# Patient Record
Sex: Female | Born: 1960 | Race: Black or African American | Hispanic: No | Smoking: Current every day smoker
Health system: Southern US, Community
[De-identification: ages and names within clinical notes are randomized; demographics above are authoritative.]

## PROBLEM LIST (undated history)

## (undated) DIAGNOSIS — M797 Fibromyalgia: Secondary | ICD-10-CM

## (undated) DIAGNOSIS — M1990 Unspecified osteoarthritis, unspecified site: Secondary | ICD-10-CM

## (undated) HISTORY — PX: ABDOMINAL HYSTERECTOMY: SHX81

---

## 2012-03-14 ENCOUNTER — Emergency Department (HOSPITAL_COMMUNITY): Payer: Self-pay

## 2012-03-14 ENCOUNTER — Emergency Department (HOSPITAL_COMMUNITY)
Admission: EM | Admit: 2012-03-14 | Discharge: 2012-03-14 | Disposition: A | Discharge status: Home / Self Care | Payer: Self-pay | Attending: Emergency Medicine | Admitting: Emergency Medicine

## 2012-03-14 ENCOUNTER — Encounter (HOSPITAL_COMMUNITY): Payer: Self-pay

## 2012-03-14 DIAGNOSIS — M129 Arthropathy, unspecified: Secondary | ICD-10-CM | POA: Insufficient documentation

## 2012-03-14 DIAGNOSIS — IMO0001 Reserved for inherently not codable concepts without codable children: Secondary | ICD-10-CM | POA: Insufficient documentation

## 2012-03-14 DIAGNOSIS — F17200 Nicotine dependence, unspecified, uncomplicated: Secondary | ICD-10-CM | POA: Insufficient documentation

## 2012-03-14 DIAGNOSIS — M79609 Pain in unspecified limb: Secondary | ICD-10-CM | POA: Insufficient documentation

## 2012-03-14 DIAGNOSIS — M79673 Pain in unspecified foot: Secondary | ICD-10-CM

## 2012-03-14 HISTORY — DX: Unspecified osteoarthritis, unspecified site: M19.90

## 2012-03-14 HISTORY — DX: Fibromyalgia: M79.7

## 2012-03-14 MED ORDER — OXYCODONE-ACETAMINOPHEN 5-325 MG PO TABS
1.0000 | ORAL_TABLET | Freq: Once | ORAL | Status: AC
  Administered 2012-03-14: 1 via ORAL
  Filled 2012-03-14: qty 1

## 2012-03-14 MED ORDER — HYDROCODONE-ACETAMINOPHEN 5-500 MG PO TABS: 1.0000 | ORAL_TABLET | Freq: Four times a day (QID) | ORAL | Status: AC | PRN

## 2012-03-14 NOTE — ED Notes (Signed)
Patient transported to X-ray 

## 2012-03-14 NOTE — ED Notes (Signed)
Pt presents with continued L foot pain after having a carpet machine fall on her foot x 2 weeks ago at [employer].  Pt seen at [facility] after injury, was placed in a foot boot, but reports pain continued.

## 2012-03-14 NOTE — ED Provider Notes (Signed)
History   This chart was scribed for Jaime Chick, MD by Jaime Berry. The patient was seen in room TR10C/TR10C. Patient's care was started at 1347.     CSN: [id number]  Arrival date & time 03/14/12  1347   First MD Initiated Contact with Patient 03/14/12 1431      Chief Complaint  Patient presents with  . Foot Pain    (Consider location/radiation/quality/duration/timing/severity/associated sxs/prior treatment) Patient is a 51 y.o. female presenting with lower extremity pain. The history is provided by the patient. No language interpreter was used.  Foot Pain This is a new problem. The current episode started more than 1 week ago. The problem occurs constantly. The problem has not changed since onset.The symptoms are aggravated by standing. The symptoms are relieved by narcotics. Treatments tried: Prescription pain medication. The treatment provided significant relief.    Jaime Berry is a 51 y.o. female who presents to the Emergency Department complaining of moderate to severe, left foot pain onset 2 weeks ago when a carpet machine fell on her foot at [employer]. Patient reports soreness over her Achilles tendon. Patient says that when she tries to bear weight on her foot, pain shoots up her foot and to her knee. She also has trouble sleeping because the pain is so severe. Patient states that she was seen at [facility] immediately after injury and was placed in a foot boot, but pain has persisted. Patient says that she got x-rays done and they showed no fracture. Patient received a prescription for pain medication, but she told her nurse that she has run out. Patient hasn't yet followed up with an orthopedist, but she has an appointment on August 15. Patient has a medical history of fibromyalgia and arthritis. Surgical history includes abdominal hysterectomy. Patient is a current everyday smoker (0.5 packs/day).  Past Medical History  Diagnosis Date  . Fibromyalgia   .  Arthritis     Past Surgical History  Procedure Date  . Abdominal hysterectomy     No family history on file.  History  Substance Use Topics  . Smoking status: Current Everyday Smoker -- 0.5 packs/day  . Smokeless tobacco: Not on file  . Alcohol Use: Yes    OB History    Grav Para Term Preterm Abortions TAB SAB Ect Mult Living                  Review of Systems  Musculoskeletal:       Patient is positive for left foot pain.  All other systems reviewed and are negative.    Allergies  Review of patient's allergies indicates no known allergies.  Home Medications   Current Outpatient Rx  Name Route Sig Dispense Refill  . ALBUTEROL SULFATE HFA 108 (90 BASE) MCG/ACT IN AERS Inhalation Inhale 1-2 puffs into the lungs every 6 (six) hours as needed. As needed for shortness of breath.    Marland Kitchen XANAX PO Oral Take 1 tablet by mouth 2 (two) times daily as needed. As needed for anxiety.    Marland Kitchen GABAPENTIN 300 MG PO CAPS Oral Take 300 mg by mouth 2 (two) times daily.    Marland Kitchen HYDROCODONE-ACETAMINOPHEN 5-500 MG PO TABS Oral Take 1 tablet by mouth every 6 (six) hours as needed. As needed for pain.    . IBUPROFEN 800 MG PO TABS Oral Take 800 mg by mouth every 8 (eight) hours as needed. As needed for pain.    . OXYCODONE-ACETAMINOPHEN 10-325 MG PO TABS Oral Take  1 tablet by mouth every 6 (six) hours as needed. As needed for pain.    Marland Kitchen LYRICA PO Oral Take 1 capsule by mouth at bedtime.    Marland Kitchen PRESCRIPTION MEDICATION Oral Take 1 tablet by mouth daily as needed. Muscle relaxer. As needed for muscle spasms.    Marland Kitchen HYDROCODONE-ACETAMINOPHEN 5-500 MG PO TABS Oral Take 1-2 tablets by mouth every 6 (six) hours as needed for pain. 15 tablet 0    BP 107/68  Pulse 75  Temp 98.3 F (36.8 C) (Oral)  Resp 18  Ht 5\' 5"  (1.651 m)  Wt 150 lb (68.04 kg)  BMI 24.96 kg/m2  SpO2 98%  Physical Exam  Nursing note and vitals reviewed. Constitutional: She is oriented to person, place, and time. She appears  well-developed and well-nourished. No distress.  HENT:  Head: Normocephalic and atraumatic.  Eyes: EOM are normal. Pupils are equal, round, and reactive to light.  Neck: Neck supple. No tracheal deviation present.  Cardiovascular: Normal rate.   Pulses:      Dorsalis pedis pulses are 2+ on the left side.  Pulmonary/Chest: Effort normal. No respiratory distress.  Abdominal: Soft. She exhibits no distension.  Musculoskeletal: Normal range of motion. She exhibits no edema.       2+ DP pulse. Distally, neurovascularly intact. No bony point tenderness of left foot. Tender to palpation over distal achilles region. Left knee is normal.  Neurological: She is alert and oriented to person, place, and time. No sensory deficit.  Skin: Skin is warm and dry.  Psychiatric: She has a normal mood and affect. Her behavior is normal.    ED Course  Procedures (including critical care time) DIAGNOSTIC STUDIES: Oxygen Saturation is 98% on room air, normal by my interpretation.    COORDINATION OF CARE: 3:05pm- Patient informed of current plan for treatment and evaluation and agrees with plan at this time. Will order an X-ray to rule out any fractures unseen during patient's first ED visit.  Labs Reviewed - No data to display  Dg Ankle Complete Left  03/14/2012  *RADIOLOGY REPORT*  Clinical Data: Fall, pain, difficulty bearing weight  LEFT ANKLE COMPLETE - 3+ VIEW  Comparison: 03/14/2012  Findings: Normal alignment without fracture.  Intact malleoli, talus and calcaneus.  No radiographic swelling.  IMPRESSION: No acute finding  Original Report Authenticated By: Judie Petit. Ruel Favors, M.D.   Dg Foot Complete Left  03/14/2012  *RADIOLOGY REPORT*  Clinical Data: Left foot trauma, pain, difficulty bearing weight  LEFT FOOT - COMPLETE 3+ VIEW  Comparison: 03/14/2012 ankle exam  Findings: Normal alignment without fracture.  Slight degenerative changes of the left first MTP joint.  No fracture evident.  No radiographic  swelling or foreign body.  IMPRESSION: No acute finding  Original Report Authenticated By: Judie Petit. TREVOR Miles Costain, M.D.     1. Foot pain       MDM  Pt with continued pain in foot after injury several weeks ago.  Has been seen x 2 in [place] ED.  Pt has been wearing cam walker and has orthopedic f/u scheduled for August.  Pt is requesting pain medication.  Points primarily to distal achilles with pain- no acute tendon rupture, xrays show no bony abnormality.  Pt stongly encouraged to arrange for follow up with orthopedics.  Discharged with strict return precautions.  Pt agreeable with plan.    I personally performed the services described in this documentation, which was scribed in my presence. The recorded information has been reviewed and considered.  Jaime Chick, MD 03/15/12 [phone]

## 2012-03-14 NOTE — ED Notes (Signed)
Patient transported from X-ray 

## 2016-02-04 ENCOUNTER — Emergency Department (HOSPITAL_COMMUNITY): Payer: Medicaid - Out of State

## 2016-02-04 ENCOUNTER — Emergency Department (HOSPITAL_COMMUNITY)
Admission: EM | Admit: 2016-02-04 | Discharge: 2016-02-04 | Disposition: A | Discharge status: Home / Self Care | Payer: Medicaid - Out of State | Attending: Emergency Medicine | Admitting: Emergency Medicine

## 2016-02-04 ENCOUNTER — Encounter (HOSPITAL_COMMUNITY): Payer: Self-pay | Admitting: Emergency Medicine

## 2016-02-04 DIAGNOSIS — M1990 Unspecified osteoarthritis, unspecified site: Secondary | ICD-10-CM | POA: Diagnosis not present

## 2016-02-04 DIAGNOSIS — F17200 Nicotine dependence, unspecified, uncomplicated: Secondary | ICD-10-CM | POA: Diagnosis not present

## 2016-02-04 DIAGNOSIS — Z791 Long term (current) use of non-steroidal anti-inflammatories (NSAID): Secondary | ICD-10-CM | POA: Diagnosis not present

## 2016-02-04 DIAGNOSIS — K122 Cellulitis and abscess of mouth: Secondary | ICD-10-CM

## 2016-02-04 DIAGNOSIS — R1084 Generalized abdominal pain: Secondary | ICD-10-CM | POA: Insufficient documentation

## 2016-02-04 DIAGNOSIS — Z79899 Other long term (current) drug therapy: Secondary | ICD-10-CM | POA: Insufficient documentation

## 2016-02-04 DIAGNOSIS — R109 Unspecified abdominal pain: Secondary | ICD-10-CM

## 2016-02-04 LAB — LIPASE, BLOOD: Lipase: 18 U/L (ref 11–51)

## 2016-02-04 LAB — COMPREHENSIVE METABOLIC PANEL
ALBUMIN: 3.7 g/dL (ref 3.5–5.0)
ALK PHOS: 72 U/L (ref 38–126)
ALT: 16 U/L (ref 14–54)
AST: 20 U/L (ref 15–41)
Anion gap: 7 (ref 5–15)
BILIRUBIN TOTAL: 0.9 mg/dL (ref 0.3–1.2)
BUN: 11 mg/dL (ref 6–20)
CO2: 22 mmol/L (ref 22–32)
Calcium: 8.2 mg/dL — ABNORMAL LOW (ref 8.9–10.3)
Chloride: 109 mmol/L (ref 101–111)
Creatinine, Ser: 0.73 mg/dL (ref 0.44–1.00)
GFR calc Af Amer: >60 mL/min (ref >60)
GFR calc non Af Amer: >60 mL/min (ref >60)
GLUCOSE: 96 mg/dL (ref 65–99)
POTASSIUM: 3.6 mmol/L (ref 3.5–5.1)
Sodium: 138 mmol/L (ref 135–145)
TOTAL PROTEIN: 6.2 g/dL — AB (ref 6.5–8.1)

## 2016-02-04 LAB — RAPID STREP SCREEN (MED CTR MEBANE ONLY): Streptococcus, Group A Screen (Direct): NEGATIVE

## 2016-02-04 LAB — URINALYSIS, ROUTINE W REFLEX MICROSCOPIC
BILIRUBIN URINE: NEGATIVE
GLUCOSE, UA: NEGATIVE mg/dL
HGB URINE DIPSTICK: NEGATIVE
KETONES UR: NEGATIVE mg/dL
Leukocytes, UA: NEGATIVE
NITRITE: NEGATIVE
PH: 5.5 (ref 5.0–8.0)
Protein, ur: NEGATIVE mg/dL
Specific Gravity, Urine: <1.005 — ABNORMAL LOW (ref 1.005–1.030)

## 2016-02-04 LAB — CBC WITH DIFFERENTIAL/PLATELET
BASOS PCT: 0 %
Basophils Absolute: 0 10*3/uL (ref 0.0–0.1)
Eosinophils Absolute: 0.1 10*3/uL (ref 0.0–0.7)
Eosinophils Relative: 1 %
HEMATOCRIT: 42.9 % (ref 36.0–46.0)
Hemoglobin: 14 g/dL (ref 12.0–15.0)
Lymphocytes Relative: 21 %
Lymphs Abs: 1.8 10*3/uL (ref 0.7–4.0)
MCH: 30.4 pg (ref 26.0–34.0)
MCHC: 32.6 g/dL (ref 30.0–36.0)
MCV: 93.3 fL (ref 78.0–100.0)
MONO ABS: 0.6 10*3/uL (ref 0.1–1.0)
MONOS PCT: 6 %
NEUTROS ABS: 6.4 10*3/uL (ref 1.7–7.7)
Neutrophils Relative %: 72 %
Platelets: 217 10*3/uL (ref 150–400)
RBC: 4.6 MIL/uL (ref 3.87–5.11)
RDW: 12.4 % (ref 11.5–15.5)
WBC: 8.8 10*3/uL (ref 4.0–10.5)

## 2016-02-04 MED ORDER — DICYCLOMINE HCL 10 MG/ML IM SOLN
20.0000 mg | Freq: Once | INTRAMUSCULAR | Status: AC
  Administered 2016-02-04: 20 mg via INTRAMUSCULAR
  Filled 2016-02-04: qty 2

## 2016-02-04 MED ORDER — ONDANSETRON HCL 4 MG PO TABS: 4.0000 mg | ORAL_TABLET | Freq: Three times a day (TID) | ORAL | Status: AC | PRN

## 2016-02-04 MED ORDER — FAMOTIDINE 20 MG PO TABS: 20.0000 mg | ORAL_TABLET | Freq: Two times a day (BID) | ORAL | Status: AC

## 2016-02-04 MED ORDER — DICYCLOMINE HCL 20 MG PO TABS: 20.0000 mg | ORAL_TABLET | Freq: Four times a day (QID) | ORAL | Status: AC | PRN

## 2016-02-04 MED ORDER — AMOXICILLIN 500 MG PO CAPS: 500.0000 mg | ORAL_CAPSULE | Freq: Three times a day (TID) | ORAL | Status: AC

## 2016-02-04 MED ORDER — SODIUM CHLORIDE 0.9 % IV SOLN
INTRAVENOUS | Status: DC
  Administered 2016-02-04: 10:00:00 via INTRAVENOUS

## 2016-02-04 MED ORDER — FAMOTIDINE IN NACL 20-0.9 MG/50ML-% IV SOLN
20.0000 mg | Freq: Once | INTRAVENOUS | Status: AC
  Administered 2016-02-04: 20 mg via INTRAVENOUS
  Filled 2016-02-04: qty 50

## 2016-02-04 MED ORDER — IOPAMIDOL (ISOVUE-300) INJECTION 61%
100.0000 mL | Freq: Once | INTRAVENOUS | Status: AC | PRN
  Administered 2016-02-04: 100 mL via INTRAVENOUS

## 2016-02-04 MED ORDER — DIATRIZOATE MEGLUMINE & SODIUM 66-10 % PO SOLN
ORAL | Status: AC
Start: 2016-02-04 — End: 2016-02-04
  Administered 2016-02-04: 30 mL
  Filled 2016-02-04: qty 30

## 2016-02-04 NOTE — ED Provider Notes (Signed)
CSN: [id number]     Arrival date & time 02/04/16  0944 History   First MD Initiated Contact with Patient 02/04/16 [phone]     Chief Complaint  Patient presents with  . Abdominal Pain      HPI  Pt was seen at 0955.  Per pt, c/o gradual onset and persistence of constant generalized abd "pain" for the past 3 to 4 days.  Has been associated with multiple intermittent episodes of diarrhea.  Describes the abd pain as "bloating," which worsens after she eats.  Pt also c/o "swelling" to her uvula for the past several days. Denies sore throat, no choking, no dysphagia, no stridor, no N/V, no fevers, no back pain, no rash, no CP/SOB, no black or blood in stools.      Past Medical History  Diagnosis Date  . Fibromyalgia   . Arthritis    Past Surgical History  Procedure Laterality Date  . Abdominal hysterectomy      Social History  Substance Use Topics  . Smoking status: Current Every Day Smoker -- 0.50 packs/day  . Smokeless tobacco: None  . Alcohol Use: Yes    Review of Systems ROS: Statement: All systems negative except as marked or noted in the HPI; Constitutional: Negative for fever and chills. ; ; Eyes: Negative for eye pain, redness and discharge. ; ; ENMT: +uvula swollen. Negative for ear pain, hoarseness, nasal congestion, sinus pressure and sore throat. ; ; Cardiovascular: Negative for chest pain, palpitations, diaphoresis, dyspnea and peripheral edema. ; ; Respiratory: Negative for cough, wheezing and stridor. ; ; Gastrointestinal: +abd pain, nausea. Negative for nausea, vomiting, blood in stool, hematemesis, jaundice and rectal bleeding. . ; ; Genitourinary: Negative for dysuria, flank pain and hematuria. ; ; Musculoskeletal: Negative for back pain and neck pain. Negative for swelling and trauma.; ; Skin: Negative for pruritus, rash, abrasions, blisters, bruising and skin lesion.; ; Neuro: Negative for headache, lightheadedness and neck stiffness. Negative for weakness, altered level of  consciousness, altered mental status, extremity weakness, paresthesias, involuntary movement, seizure and syncope.      Allergies  Review of patient's allergies indicates no known allergies.  Home Medications   Prior to Admission medications   Medication Sig Start Date End Date Taking? Authorizing Provider  albuterol (PROVENTIL HFA;VENTOLIN HFA) 108 (90 BASE) MCG/ACT inhaler Inhale 1-2 puffs into the lungs every 6 (six) hours as needed. As needed for shortness of breath.    Historical Provider, MD  ALPRAZolam (XANAX PO) Take 1 tablet by mouth 2 (two) times daily as needed. As needed for anxiety.    Historical Provider, MD  gabapentin (NEURONTIN) 300 MG capsule Take 300 mg by mouth 2 (two) times daily.    Historical Provider, MD  HYDROcodone-acetaminophen (VICODIN) 5-500 MG per tablet Take 1 tablet by mouth every 6 (six) hours as needed. As needed for pain.    Historical Provider, MD  ibuprofen (ADVIL,MOTRIN) 800 MG tablet Take 800 mg by mouth every 8 (eight) hours as needed. As needed for pain.    Historical Provider, MD  oxyCODONE-acetaminophen (PERCOCET) 10-325 MG per tablet Take 1 tablet by mouth every 6 (six) hours as needed. As needed for pain.    Historical Provider, MD  Pregabalin (LYRICA PO) Take 1 capsule by mouth at bedtime.    Historical Provider, MD  PRESCRIPTION MEDICATION Take 1 tablet by mouth daily as needed. Muscle relaxer. As needed for muscle spasms.    Historical Provider, MD   BP 147/105 mmHg  Pulse 73  Temp(Src) 98.2 F (36.8 C) (Oral)  Resp 16  Ht  (1.651 m)  Wt 140 lb (63.504 kg)  BMI 23.30 kg/m2  SpO2 98% Physical Exam  1000: Physical examination:  Nursing notes reviewed; Vital signs and O2 SAT reviewed;  Constitutional: Well developed, Well nourished, Well hydrated, In no acute distress; Head:  Normocephalic, atraumatic; Eyes: EOMI, PERRL, No scleral icterus; ENMT: Mouth and pharynx normal, Mucous membranes moist. Mouth and pharynx without lesions. +mild  erythema to posterior pharynx and uvula. +mild uvula edema, no lesions. No exudates. No intra-oral edema. No submandibular or sublingual edema. No hoarse voice, no drooling, no stridor. No pain with manipulation of larynx. No trismus. ; Neck: Supple, Full range of motion, No lymphadenopathy; Cardiovascular: Regular rate and rhythm, No gallop; Respiratory: Breath sounds clear & equal bilaterally, No wheezes.  Speaking full sentences with ease, Normal respiratory effort/excursion; Chest: Nontender, Movement normal; Abdomen: Soft, +diffuse tenderness to palp. No rebound or guarding. Nondistended, Normal bowel sounds; Genitourinary: No CVA tenderness; Extremities: Pulses normal, No tenderness, No edema, No calf edema or asymmetry.; Neuro: AA&Ox3, Major CN grossly intact.  Speech clear. No gross focal motor or sensory deficits in extremities.; Skin: Color normal, Warm, Dry.   ED Course  Procedures (including critical care time) Labs Review  Imaging Review  I have personally reviewed and evaluated these images and lab results as part of my medical decision-making.   EKG Interpretation None      MDM  MDM Reviewed: previous chart, nursing note and vitals Reviewed previous: labs Interpretation: labs, x-ray and CT scan     Results for orders placed or performed during the hospital encounter of 02/04/16  Rapid strep screen  Result Value Ref Range   Streptococcus, Group A Screen (Direct) NEGATIVE NEGATIVE  Comprehensive metabolic panel  Result Value Ref Range   Sodium 138 135 - 145 mmol/L   Potassium 3.6 3.5 - 5.1 mmol/L   Chloride 109 101 - 111 mmol/L   CO2 22 22 - 32 mmol/L   Glucose, Bld 96 65 - 99 mg/dL   BUN 11 6 - 20 mg/dL   Creatinine, Ser 9.60 0.44 - 1.00 mg/dL   Calcium 8.2 (L) 8.9 - 10.3 mg/dL   Total Protein 6.2 (L) 6.5 - 8.1 g/dL   Albumin 3.7 3.5 - 5.0 g/dL   AST 20 15 - 41 U/L   ALT 16 14 - 54 U/L   Alkaline Phosphatase 72 38 - 126 U/L   Total Bilirubin 0.9 0.3 - 1.2  mg/dL   GFR calc non Af Amer >60 >60 mL/min   GFR calc Af Amer >60 >60 mL/min   Anion gap 7 5 - 15  Lipase, blood  Result Value Ref Range   Lipase 18 11 - 51 U/L  CBC with Differential  Result Value Ref Range   WBC 8.8 4.0 - 10.5 K/uL   RBC 4.60 3.87 - 5.11 MIL/uL   Hemoglobin 14.0 12.0 - 15.0 g/dL   HCT 45.4 09.8 - 11.9 %   MCV 93.3 78.0 - 100.0 fL   MCH 30.4 26.0 - 34.0 pg   MCHC 32.6 30.0 - 36.0 g/dL   RDW 14.7 82.9 - 56.2 %   Platelets 217 150 - 400 K/uL   Neutrophils Relative % 72 %   Neutro Abs 6.4 1.7 - 7.7 K/uL   Lymphocytes Relative 21 %   Lymphs Abs 1.8 0.7 - 4.0 K/uL   Monocytes Relative 6 %   Monocytes Absolute 0.6 0.1 -  1.0 K/uL   Eosinophils Relative 1 %   Eosinophils Absolute 0.1 0.0 - 0.7 K/uL   Basophils Relative 0 %   Basophils Absolute 0.0 0.0 - 0.1 K/uL  Urinalysis, Routine w reflex microscopic  Result Value Ref Range   Color, Urine YELLOW YELLOW   APPearance HAZY (A) CLEAR   Specific Gravity, Urine <1.005 (L) 1.005 - 1.030   pH 5.5 5.0 - 8.0   Glucose, UA NEGATIVE NEGATIVE mg/dL   Hgb urine dipstick NEGATIVE NEGATIVE   Bilirubin Urine NEGATIVE NEGATIVE   Ketones, ur NEGATIVE NEGATIVE mg/dL   Protein, ur NEGATIVE NEGATIVE mg/dL   Nitrite NEGATIVE NEGATIVE   Leukocytes, UA NEGATIVE NEGATIVE   Dg Chest 2 View 02/04/2016  CLINICAL DATA:  Abdominal pain.  Diarrhea.  Smoker. EXAM: CHEST  2 VIEW COMPARISON:  No recent prior. FINDINGS: Mediastinum hilar structures normal. Borderline cardiomegaly. Normal pulmonary vascularity. No focal infiltrate. Tiny bilateral pleural effusions cannot be excluded . No pneumothorax. No acute bony abnormality . IMPRESSION: Borderline cardiomegaly. Tiny bilateral pleural effusions cannot be excluded Electronically Signed   By: Maisie Fushomas  Register   On: 02/04/2016 11:40   Ct Abdomen Pelvis W Contrast 02/04/2016  CLINICAL DATA:  Patient c/o abd pain and bloating for 4 days, diarrhea x 2 days, and swelling to throat/ hx hysterectomy  EXAM: CT ABDOMEN AND PELVIS WITH CONTRAST TECHNIQUE: Multidetector CT imaging of the abdomen and pelvis was performed using the standard protocol following bolus administration of intravenous contrast. CONTRAST:  100mL ISOVUE-300 IOPAMIDOL (ISOVUE-300) INJECTION 61% COMPARISON:  None. FINDINGS: Lung bases:  Clear.  Heart normal size. Liver, spleen, gallbladder, pancreas, adrenal glands:  Normal. Kidneys, ureters, bladder:  Normal. Uterus and adnexa: Uterus surgically absent. No pelvic/ adnexal masses. Vascular: There are atherosclerotic calcifications along the aorta and iliac vessels. No aneurysm. Lymph nodes:  No adenopathy. Ascites:  None. Gastrointestinal: Normal stomach and small bowel. There are few scattered colonic diverticula. No diverticulitis. Colon otherwise unremarkable. Appendix is surgically absent. Musculoskeletal: Disc degenerative changes at L5-S1. No other abnormalities. IMPRESSION: 1. No acute findings. No findings to account for this patient's symptoms. 2. There are few colonic diverticula but no evidence of diverticulitis or other colonic inflammation. 3. Status post hysterectomy and appendectomy. 4. Degenerative changes at L5-S1. 5. Atherosclerosis of the abdominal aorta. Electronically Signed   By: Amie Portlandavid  Ormond M.D.   On: 02/04/2016 11:25    1300:  Workup reassuring. Pt has tol PO well while in the ED without N/V.  No stooling while in the ED.  Abd benign, VSS. Feels better and wants to go home now. Tx abd pain symptomatically, f/u GI MD. Tx uvulitis with abx. Dx and testing d/w pt and family.  Questions answered.  Verb understanding, agreeable to d/c home with outpt f/u.    Samuel JesterKathleen Helia Haese, DO 02/09/16 1242

## 2016-02-04 NOTE — Discharge Instructions (Signed)
Eat a bland diet, avoiding greasy, fatty, fried foods, as well as spicy and acidic foods or beverages.  Avoid eating within the hour or 2 before going to bed or laying down.  Also avoid teas, colas, coffee, chocolate, pepermint and spearment. Take the prescriptions as directed.  Call your regular medical doctor and the GI doctor today schedule a follow up appointment within the next week.  Return to the Emergency Department immediately if worsening.

## 2016-02-04 NOTE — ED Notes (Signed)
Patient with no complaints at this time. Respirations even and unlabored. Skin warm/dry. Discharge instructions reviewed with patient at this time. Patient given opportunity to voice concerns/ask questions. IV removed per policy and band-aid applied to site. Patient discharged at this time and left Emergency Department with steady gait.  

## 2016-02-04 NOTE — ED Notes (Signed)
Pt notified of need for UA.  States she will ring call light when she can produce one.

## 2016-02-04 NOTE — ED Notes (Signed)
C/o abdominal pain for last 4 days.  C/o bloating and pain, rates pain 10/10.  C/o swelling to back of throat, rates pain 9/10.  Loose stools for two days, currently stools are back to normal.

## 2016-02-07 LAB — CULTURE, GROUP A STREP (THRC)

## 2017-02-17 IMAGING — DX DG CHEST 2V
2 series · 2 of 2 positions shown · non-contrast
Comparison: No recent prior.

CLINICAL DATA: Abdominal pain.  Diarrhea.  Smoker.

EXAM:
CHEST  2 VIEW

[chest pa]
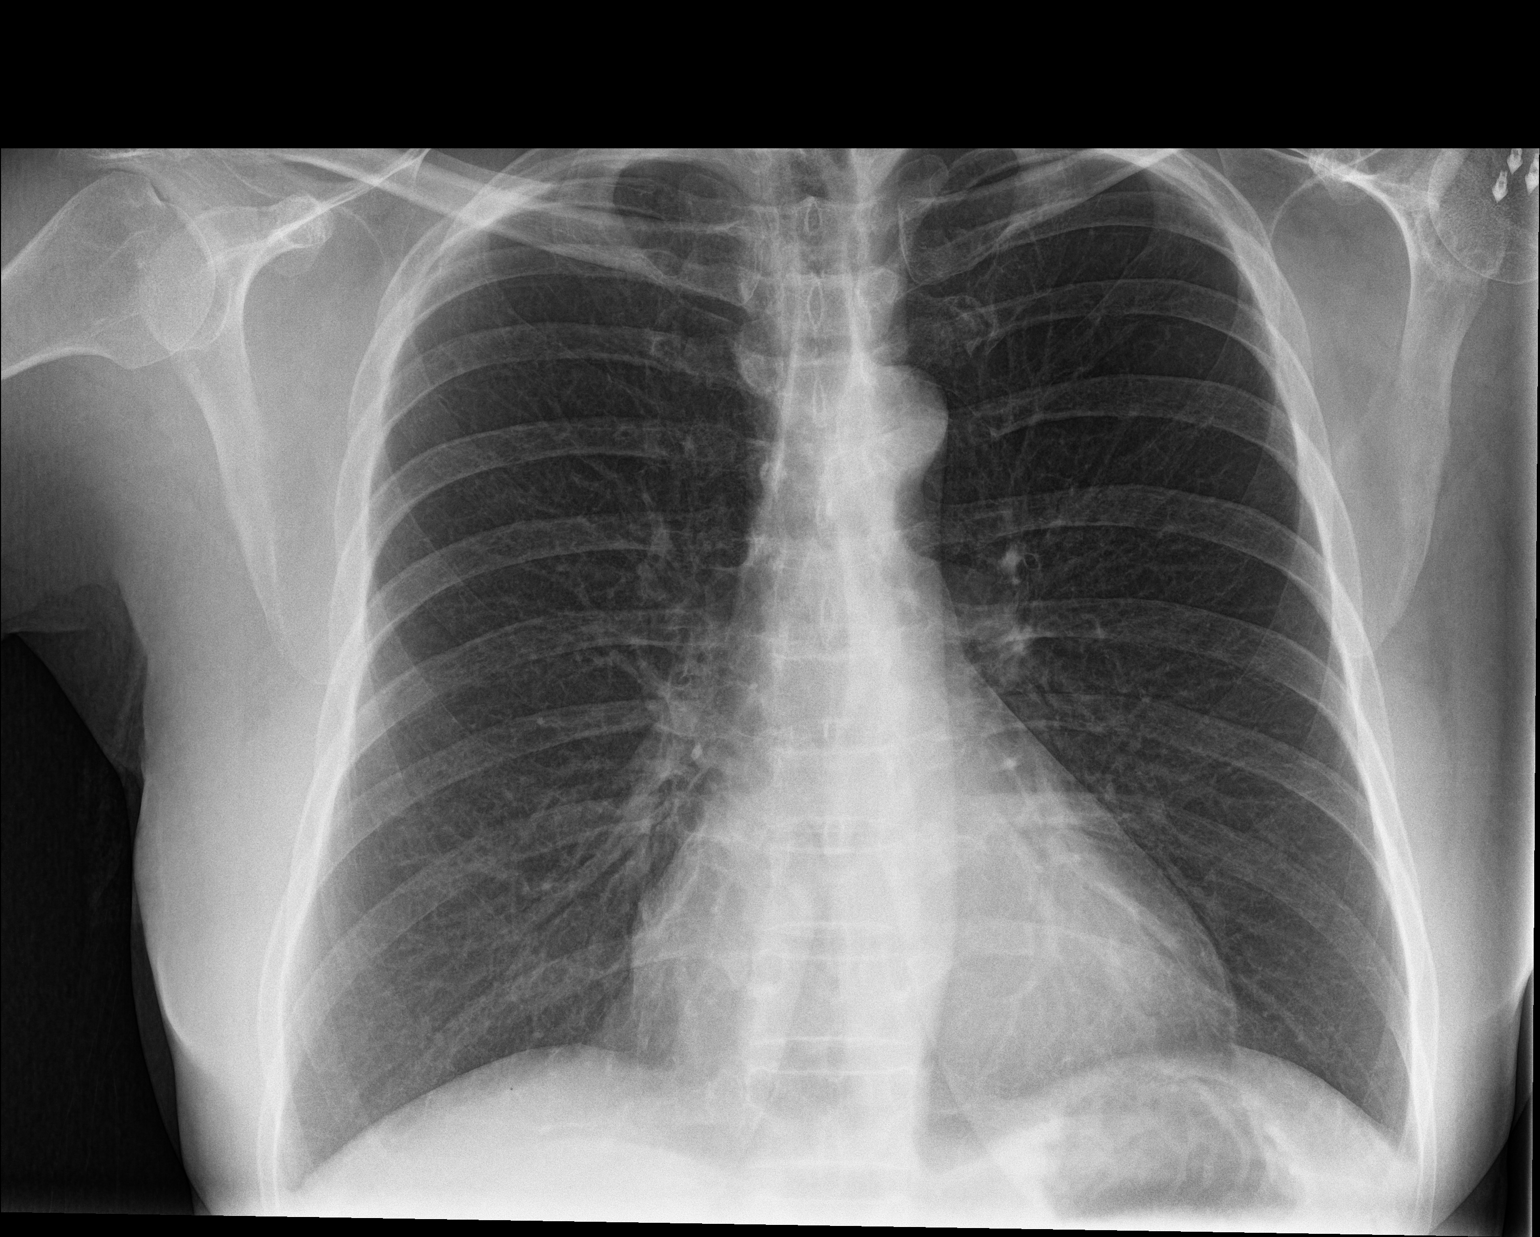

[chest lat]
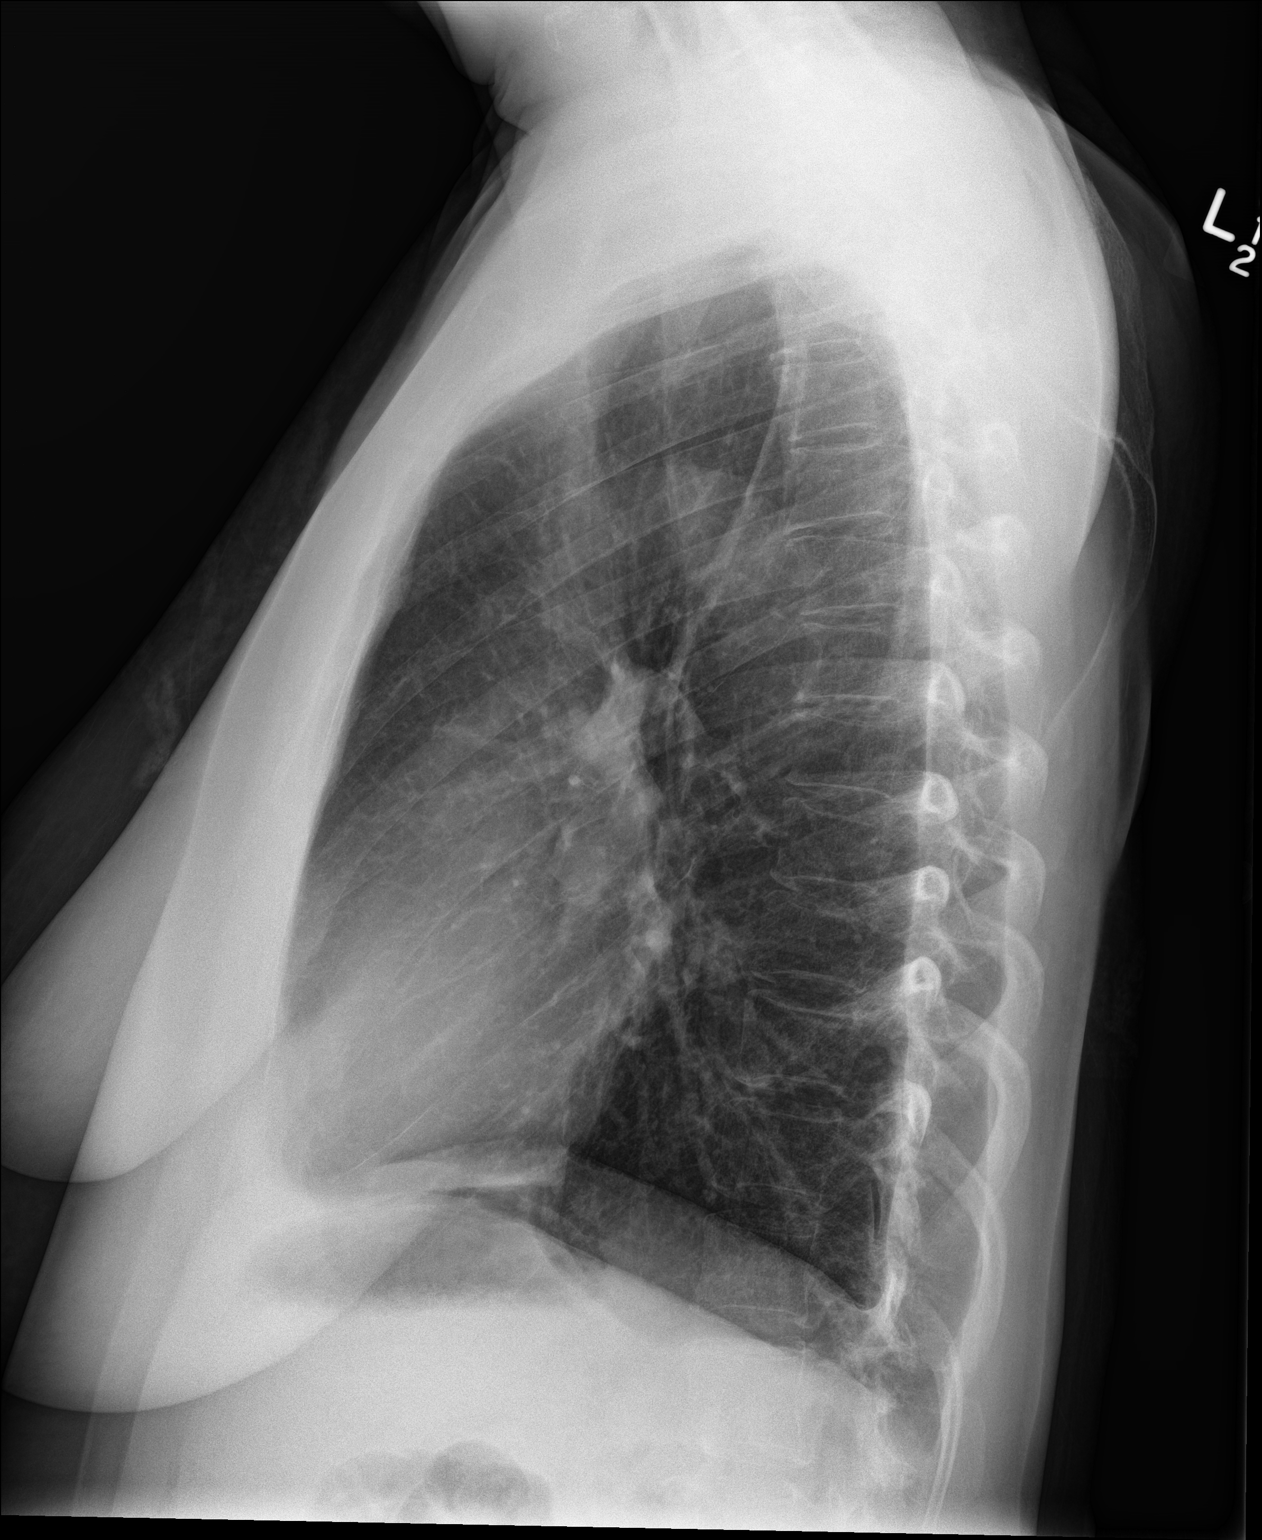

[2 of 2 positions shown; findings below may reference images not displayed]

FINDINGS: Mediastinum hilar structures normal. Borderline cardiomegaly. Normal
pulmonary vascularity. No focal infiltrate. Tiny bilateral pleural
effusions cannot be excluded . No pneumothorax. No acute bony
abnormality .
IMPRESSION: Borderline cardiomegaly. Tiny bilateral pleural effusions cannot be
excluded

## 2017-02-17 IMAGING — CT CT ABD-PELV W/ CM
2 of 5 series · 16 of 46 positions shown, 18 images · IV contrast (iopamidol)
Comparison: None.

CLINICAL DATA: Patient c/o abd pain and bloating for 4 days,
diarrhea x 2 days, and swelling to throat/ hx hysterectomy

EXAM:
CT ABDOMEN AND PELVIS WITH CONTRAST
TECHNIQUE: Multidetector CT imaging of the abdomen and pelvis was performed
using the standard protocol following bolus administration of
intravenous contrast.
CONTRAST:  100mL T0FKNK-9MM IOPAMIDOL (T0FKNK-9MM) INJECTION 61%

[Series 2: routine abd pel with · axial · 0.64mm/px · z∈[-445,-45]mm · 13 of 90 slices shown, 15 images]
[im 5/90  soft-tissue]
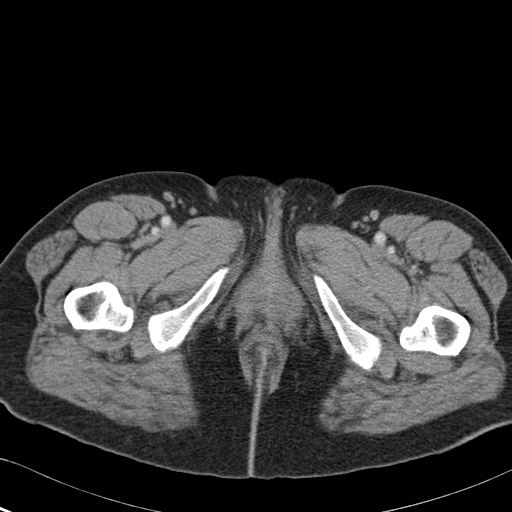
[im 5/90  bone]
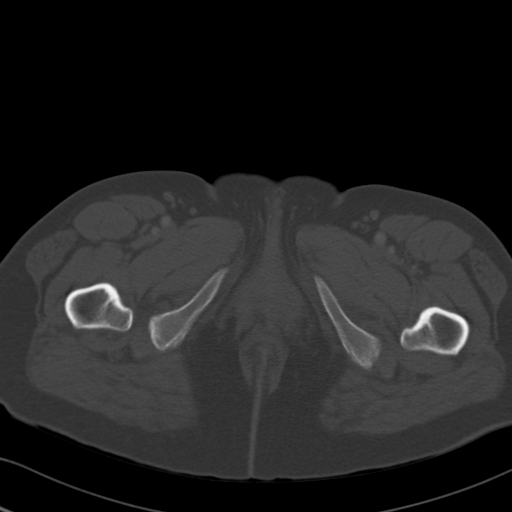
[im 15/90  soft-tissue]
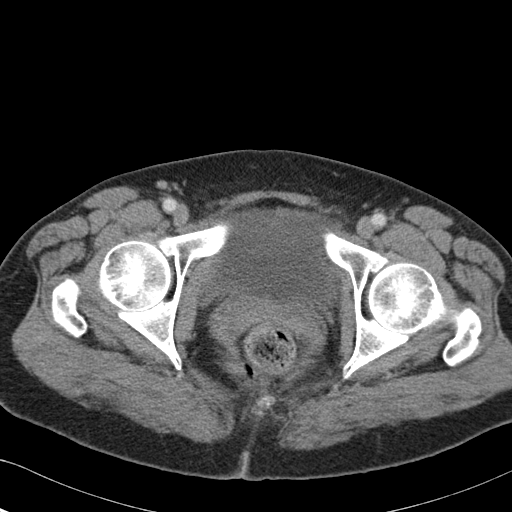
[im 19/90  soft-tissue]
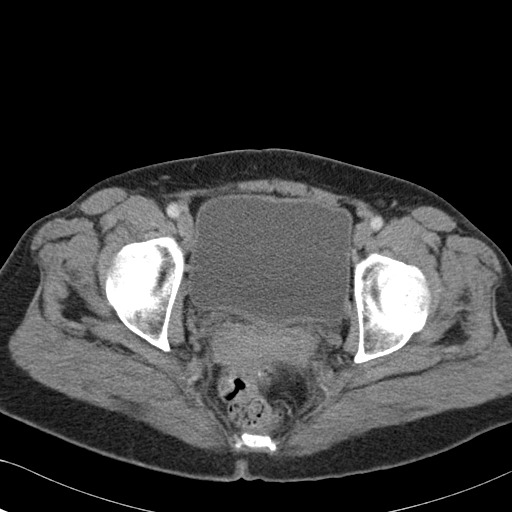
[im 24/90  soft-tissue]
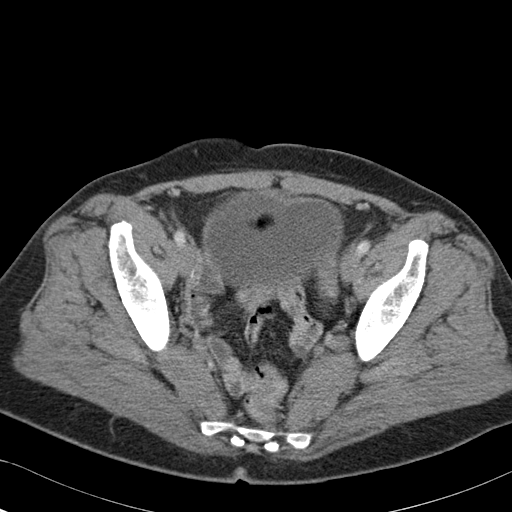
[im 33/90  soft-tissue]
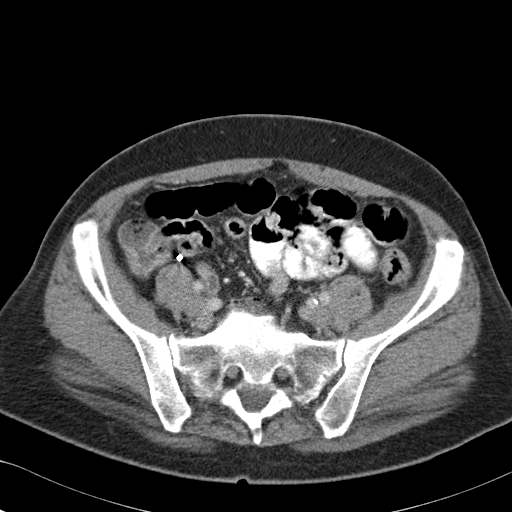
[im 38/90  soft-tissue]
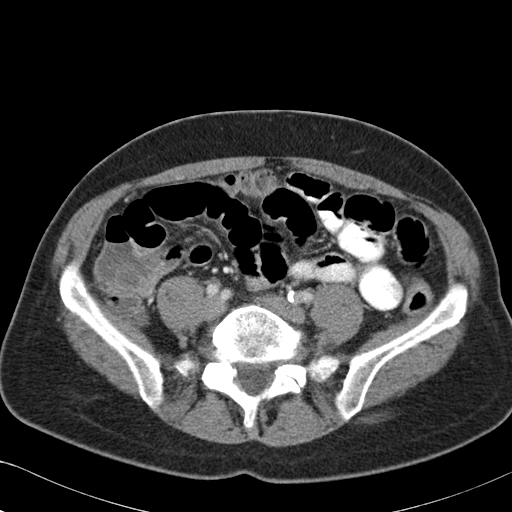
[im 47/90  soft-tissue]
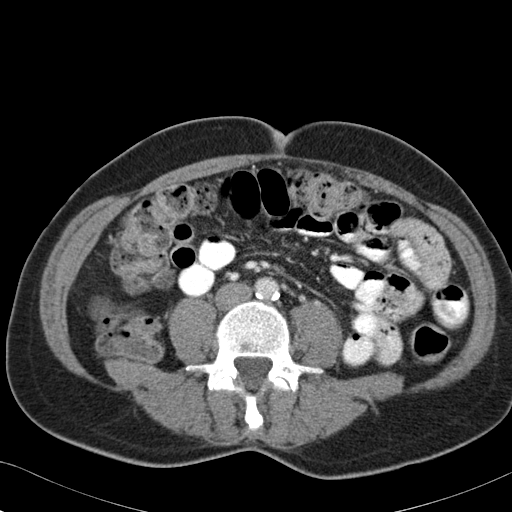
[im 52/90  soft-tissue]
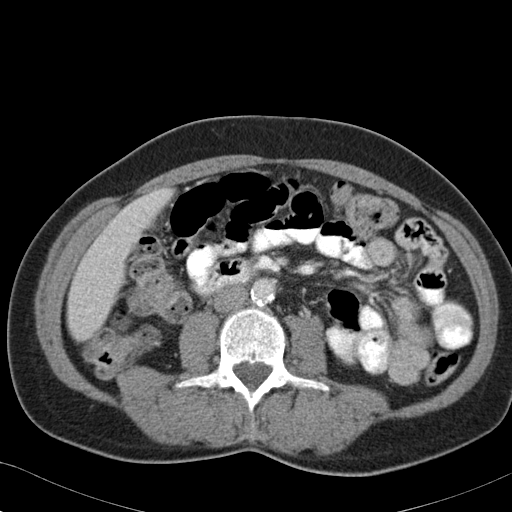
[im 57/90  soft-tissue]
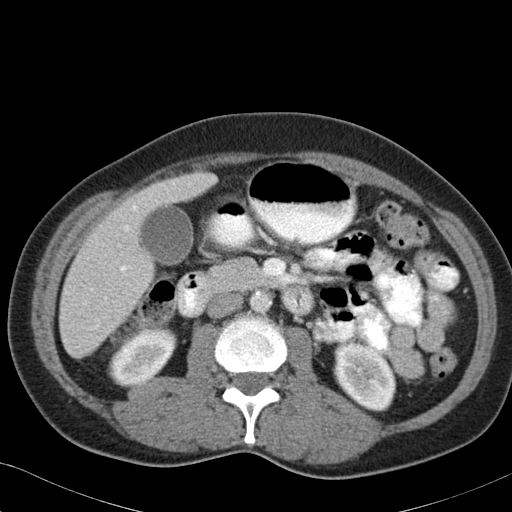
[im 57/90  bone]
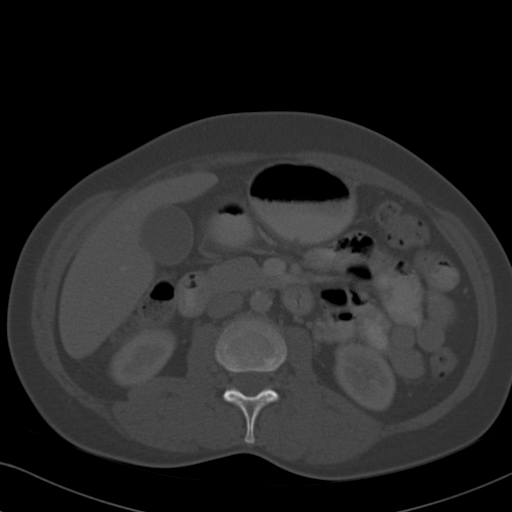
[im 66/90  soft-tissue]
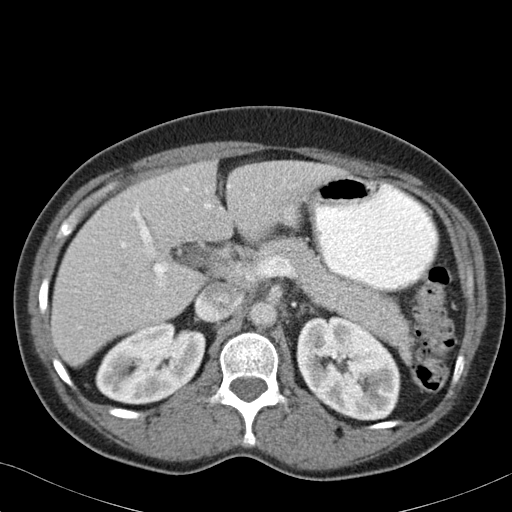
[im 71/90  soft-tissue]
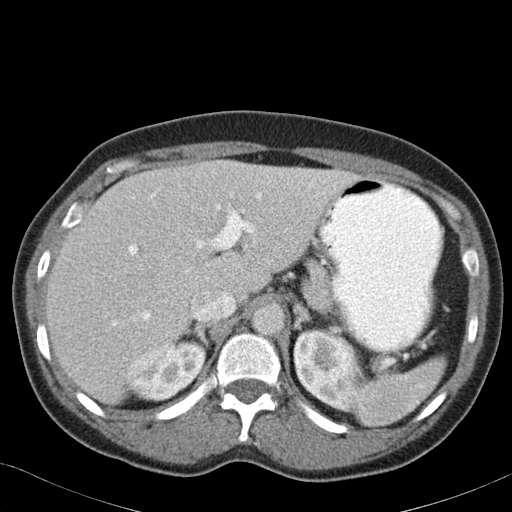
[im 75/90  soft-tissue]
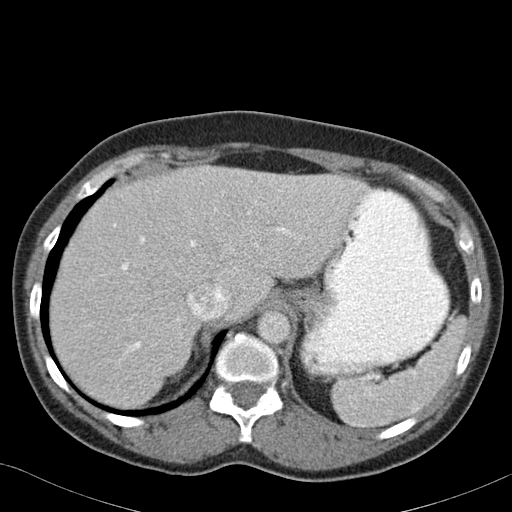
[im 85/90  soft-tissue]
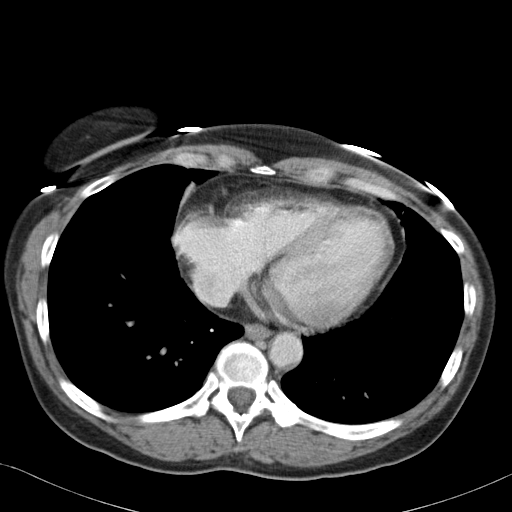

[Series 4: coronal · coronal · 0.66mm/px · 3 of 113 slices shown]
[im 38/113  soft-tissue]
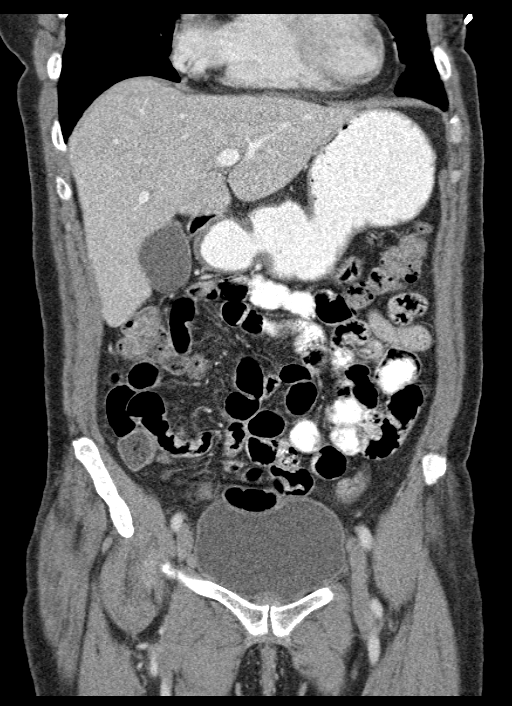
[im 50/113  soft-tissue]
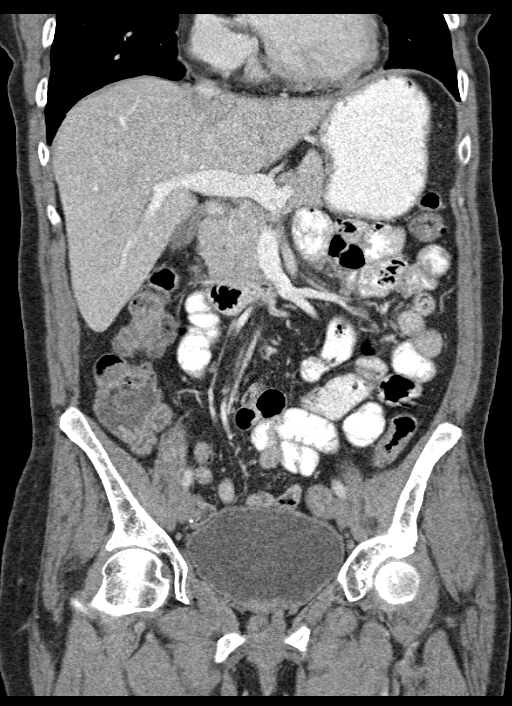
[im 63/113  soft-tissue]
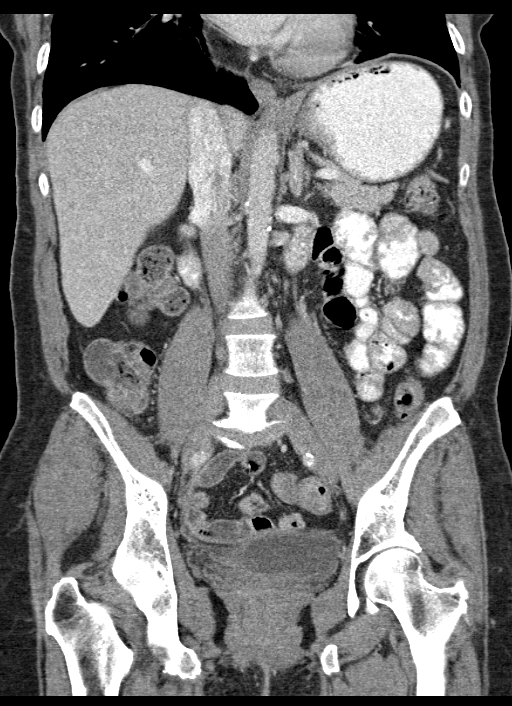

[16 of 46 positions shown; findings below may reference images not displayed]

FINDINGS: Lung bases:  Clear.  Heart normal size.

Liver, spleen, gallbladder, pancreas, adrenal glands:  Normal.

Kidneys, ureters, bladder:  Normal.

Uterus and adnexa: Uterus surgically absent. No pelvic/ adnexal
masses.

Vascular: There are atherosclerotic calcifications along the aorta
and iliac vessels. No aneurysm.

Lymph nodes:  No adenopathy.

Ascites:  None.

Gastrointestinal: Normal stomach and small bowel. There are few
scattered colonic diverticula. No diverticulitis. Colon otherwise
unremarkable. Appendix is surgically absent.

Musculoskeletal: Disc degenerative changes at L5-S1. No other
abnormalities.
IMPRESSION: 1. No acute findings. No findings to account for this patient's
symptoms.
2. There are few colonic diverticula but no evidence of
diverticulitis or other colonic inflammation.
3. Status post hysterectomy and appendectomy.
4. Degenerative changes at L5-S1.
5. Atherosclerosis of the abdominal aorta.

## 2023-05-26 ENCOUNTER — Other Ambulatory Visit: Payer: Self-pay

## 2023-05-26 ENCOUNTER — Emergency Department (HOSPITAL_COMMUNITY): Payer: Medicaid Other

## 2023-05-26 ENCOUNTER — Encounter (HOSPITAL_COMMUNITY): Payer: Self-pay

## 2023-05-26 ENCOUNTER — Emergency Department (HOSPITAL_COMMUNITY)
Admission: EM | Admit: 2023-05-26 | Discharge: 2023-05-26 | Disposition: A | Discharge status: Home / Self Care | Payer: Medicaid Other | Attending: Student | Admitting: Student

## 2023-05-26 DIAGNOSIS — M545 Low back pain, unspecified: Secondary | ICD-10-CM | POA: Diagnosis present

## 2023-05-26 DIAGNOSIS — M5442 Lumbago with sciatica, left side: Secondary | ICD-10-CM

## 2023-05-26 MED ORDER — LIDOCAINE 5 % EX PTCH
1.0000 | MEDICATED_PATCH | CUTANEOUS | 0 refills | Status: AC
Start: 2023-05-26 — End: ?

## 2023-05-26 MED ORDER — OXYCODONE-ACETAMINOPHEN 5-325 MG PO TABS
1.0000 | ORAL_TABLET | Freq: Once | ORAL | Status: AC
  Administered 2023-05-26: 1 via ORAL
  Filled 2023-05-26: qty 1

## 2023-05-26 MED ORDER — IBUPROFEN 400 MG PO TABS
600.0000 mg | ORAL_TABLET | Freq: Once | ORAL | Status: AC
  Administered 2023-05-26: 600 mg via ORAL
  Filled 2023-05-26: qty 2

## 2023-05-26 MED ORDER — LIDOCAINE 5 % EX PTCH
1.0000 | MEDICATED_PATCH | CUTANEOUS | Status: DC
  Administered 2023-05-26: 1 via TRANSDERMAL
  Filled 2023-05-26: qty 1

## 2023-05-26 MED ORDER — OXYCODONE-ACETAMINOPHEN 5-325 MG PO TABS
1.0000 | ORAL_TABLET | Freq: Four times a day (QID) | ORAL | 0 refills | Status: AC | PRN
Start: 2023-05-26 — End: ?

## 2023-05-26 MED ORDER — PREDNISONE 50 MG PO TABS
50.0000 mg | ORAL_TABLET | Freq: Every day | ORAL | 0 refills | Status: AC
Start: 2023-05-26 — End: 2023-05-31

## 2023-05-26 NOTE — ED Notes (Signed)
Denies any injuries.  Burning pain that radiates down bilateral legs. Took flexeril yesterday without relief.

## 2023-05-26 NOTE — ED Notes (Signed)
Pt verbalized understanding of no driving and to use caution within 4 hours of taking pain meds due to meds cause drowsiness, also made aware of constipation is a side effect.

## 2023-05-26 NOTE — ED Provider Notes (Signed)
[place] EMERGENCY DEPARTMENT AT [facility] Provider Note   CSN: [id number] Arrival date & time: 05/26/23  1154     History  Chief Complaint  Patient presents with   Back Pain    Jaime Berry is a 62 y.o. female.  She has PMH of fibromyalgia, IBS, arthritis.  Presents the ER today complaining of left lower back pain for the past 5 days of burning sensation rating down left buttock and posterior thigh.  No leg swelling.  No injury or trauma no saddle anesthesia or paresthesia, no bowel or bladder incontinence, no weight loss.  She never had this in the past.  Denies urinary symptoms, no abdominal pain or flank pain.  He tried Flexeril at home without relief.   Back Pain      Home Medications Prior to Admission medications   Medication Sig Start Date End Date Taking? Authorizing Provider  albuterol (PROVENTIL HFA;VENTOLIN HFA) 108 (90 BASE) MCG/ACT inhaler Inhale 1-2 puffs into the lungs every 6 (six) hours as needed. As needed for shortness of breath.    [provider]  amoxicillin (AMOXIL) 500 MG capsule Take 1 capsule (500 mg total) by mouth 3 (three) times daily. 02/04/16   Samuel Jester, DO  cetirizine (ZYRTEC) 5 MG tablet Take 5 mg by mouth daily. 01/06/16   [provider]  dicyclomine (BENTYL) 20 MG tablet Take 1 tablet (20 mg total) by mouth every 6 (six) hours as needed for spasms (abdominal cramping). 02/04/16   Samuel Jester, DO  famotidine (PEPCID) 20 MG tablet Take 1 tablet (20 mg total) by mouth 2 (two) times daily. 02/04/16   Samuel Jester, DO  gabapentin (NEURONTIN) 300 MG capsule Take 300 mg by mouth 3 (three) times daily as needed (pain).     [provider]  methocarbamol (ROBAXIN) 500 MG tablet Take 500 mg by mouth daily as needed for muscle spasms.     [provider]  ondansetron (ZOFRAN) 4 MG tablet Take 1 tablet (4 mg total) by mouth every 8 (eight) hours as needed for nausea or vomiting. 02/04/16    Samuel Jester, DO      Allergies    Asa [aspirin]    Review of Systems   Review of Systems  Musculoskeletal:  Positive for back pain.    Physical Exam Updated Vital Signs BP 125/80 (BP Location: Right Arm)   Pulse 69   Temp 97.6 F (36.4 C)   Resp 18   Ht 5\' 5"  (1.651 m)   Wt 63.5 kg   SpO2 99%   BMI 23.30 kg/m  Physical Exam Vitals and nursing note reviewed.  Constitutional:      General: She is not in acute distress.    Appearance: She is well-developed.  HENT:     Head: Normocephalic and atraumatic.  Eyes:     Conjunctiva/sclera: Conjunctivae normal.  Cardiovascular:     Rate and Rhythm: Normal rate and regular rhythm.     Heart sounds: No murmur heard. Pulmonary:     Effort: Pulmonary effort is normal. No respiratory distress.     Breath sounds: Normal breath sounds.  Abdominal:     Palpations: Abdomen is soft.     Tenderness: There is no abdominal tenderness.  Musculoskeletal:        General: No swelling.     Cervical back: Neck supple.     Right lower leg: No edema.     Left lower leg: No edema.     Comments:  Denies send spasm left lumbar area with positive left straight leg raise.  No overlying skin changes or swelling in the back  Skin:    General: Skin is warm and dry.     Capillary Refill: Capillary refill takes less than 2 seconds.     Findings: No lesion or rash.  Neurological:     General: No focal deficit present.     Mental Status: She is alert and oriented to person, place, and time.  Psychiatric:        Mood and Affect: Mood normal.     ED Results / Procedures / Treatments   Labs (all labs ordered are listed, but only abnormal results are displayed) Labs Reviewed - No data to display  EKG None  Radiology DG Lumbar Spine Complete  Result Date: 05/26/2023 CLINICAL DATA:  Lower back pain for 1 week. EXAM: LUMBAR SPINE - COMPLETE 4+ VIEW COMPARISON:  CT abdomen and pelvis dated 02/04/2016. FINDINGS: There is no evidence of lumbar  spine fracture. Alignment is normal. Up to moderate multilevel degenerative disc and joint disease, most significant at L5-S1. There is mild neuroforaminal stenosis at L5-S1 IMPRESSION: Moderate degenerative changes. Electronically Signed   By: Romona Curls M.D.   On: 05/26/2023 13:21    Procedures Procedures    Medications Ordered in ED Medications - No data to display  ED Course/ Medical Decision Making/ A&P                                 Medical Decision Making This patient presents to the ED for concern of back pain this involves an extensive number of treatment options, and is a complaint that carries with it a high risk of complications and morbidity.  The differential diagnosis includes sprain, strain, HNP, fracture, DDD, muscle spasm, cauda equina, epidural abscess or hematoma, malignancy, other   Co morbidities that complicate the patient evaluation  Fibromyalgia   Additional history obtained:  Additional history obtained from EMR External records from outside source obtained and reviewed including notes, PDMP   Imaging Studies ordered:  I ordered imaging studies including stray lumbar spine I independently visualized and interpreted imaging which showed degenerative disc disease worse at L5-S1, no fracture or traumatic malalignment I agree with the radiologist interpretation      Problem List / ED Course / Critical interventions / Medication management  Patient presents with low back pain left low back radiating down left leg with burning sensation. Differential considered as above. Symptoms are likely due to a colopathy likely from degenerative disc disease. They have no high risk features including saddle anesthesia/paresthesia, bowel or bladder incontinence, urinary retention, fever, weight loss, history of cancer or immune suppression, or IV drug use. We discussed plan to treat symptoms with analgesics, muscle relaxers, Lidoderm patch, and follow up with her  orthopedic doctor in [place]. They were advised on strict return precautions.  I ordered medication including percocet, lidoderm  for pain  Reevaluation of the patient after these medicines showed that the patient improved I have reviewed the patients home medicines and have made adjustments as needed      Amount and/or Complexity of Data Reviewed Radiology: ordered and independent interpretation performed.  Risk Prescription drug management.           Final Clinical Impression(s) / ED Diagnoses Final diagnoses:  None    Rx / DC Orders ED Discharge Orders     None  Josem Kaufmann 05/26/23 1657    Glendora Score, MD 05/27/23 [phone]

## 2023-05-26 NOTE — ED Triage Notes (Signed)
Pinching pain in back x5 days Radiation down RIGHT leg Took flexeril at home without relief

## 2023-05-26 NOTE — Discharge Instructions (Addendum)
Seen today for back pain your symptoms are consistent with sciatica.  Given prescription for pain medicine and steroids and lidocaine patches.  You can follow-up with your doctor in [place].  Come back to the ER if you have new or worsening symptoms.
